# Patient Record
Sex: Female | Born: 1990 | Race: Black or African American | Hispanic: No | Marital: Single | Smoking: Never smoker
Health system: Southern US, Community
[De-identification: ages and names within clinical notes are randomized; demographics above are authoritative.]

---

## 2012-10-20 ENCOUNTER — Ambulatory Visit (INDEPENDENT_AMBULATORY_CARE_PROVIDER_SITE_OTHER): Payer: BC Managed Care – PPO | Admitting: Emergency Medicine

## 2012-10-20 VITALS — BP 114/73 | HR 74 | Temp 98.2°F | Resp 16 | Ht 65.0 in | Wt 184.0 lb

## 2012-10-20 DIAGNOSIS — T887XXA Unspecified adverse effect of drug or medicament, initial encounter: Secondary | ICD-10-CM

## 2012-10-20 DIAGNOSIS — T50905A Adverse effect of unspecified drugs, medicaments and biological substances, initial encounter: Secondary | ICD-10-CM

## 2012-10-20 DIAGNOSIS — L500 Allergic urticaria: Secondary | ICD-10-CM

## 2012-10-20 MED ORDER — METHYLPREDNISOLONE ACETATE 80 MG/ML IJ SUSP
120.0000 mg | Freq: Once | INTRAMUSCULAR | Status: AC
  Administered 2012-10-20: 120 mg via INTRAMUSCULAR

## 2012-10-20 MED ORDER — DIPHENHYDRAMINE HCL 25 MG PO TABS: 25.0000 mg | ORAL_TABLET | Freq: Four times a day (QID) | ORAL | Status: DC | PRN

## 2012-10-20 NOTE — Addendum Note (Signed)
Addended by: Carmelina Dane on: 10/20/2012 10:48 AM   Modules accepted: Orders

## 2012-10-20 NOTE — Patient Instructions (Addendum)

## 2012-10-20 NOTE — Progress Notes (Signed)
[facility] [address], [place] [address] [phone]  Date:  10/20/2012   Name:  Taylor Daniel   DOB:  03-31-1991   MRN:  [id number]  PCP:  No primary provider on file.    Chief Complaint: Rash   History of Present Illness:  Taylor Daniel is a 22 y.o. very pleasant female patient who presents with the following:  Took flagyl for a week for BV and stopped four days ago.  Now has an erythematous pruritic rash on abdomen, back, neck and both axillae.  No fever chills, blisters, nausea or vomiting.  No other complaints or concerns.  No improvement with topical lotions.  There is no problem list on file for this patient.   History reviewed. No pertinent past medical history.  History reviewed. No pertinent past surgical history.  History  Substance Use Topics  . Smoking status: Never Smoker   . Smokeless tobacco: Not on file  . Alcohol Use: Not on file    History reviewed. No pertinent family history.  No Known Allergies  Medication list has been reviewed and updated.  No current outpatient prescriptions on file prior to visit.   No current facility-administered medications on file prior to visit.    Review of Systems:  As per HPI, otherwise negative.    Physical Examination: Filed Vitals:   10/20/12 0945  BP: 114/73  Pulse: 74  Temp: 98.2 F (36.8 C)  Resp: 16   Filed Vitals:   10/20/12 0945  Height: 5\' 5"  (1.651 m)  Weight: 184 lb (83.462 kg)   Body mass index is 30.62 kg/(m^2). Ideal Body Weight: Weight in (lb) to have BMI = 25: 149.9   GEN: WDWN, NAD, Non-toxic, Alert & Oriented x 3 HEENT: Atraumatic, Normocephalic.  Ears and Nose: No external deformity. EXTR: No clubbing/cyanosis/edema NEURO: Normal gait.  PSYCH: Normally interactive. Conversant. Not depressed or anxious appearing.  Calm demeanor.  SKIN:  Urticarial rash on trunk and arms.  Assessment and Plan: Allergic hives secondary to  flagyl Benadryl Depo medrol  Carmelina Dane, MD

## 2014-05-31 ENCOUNTER — Ambulatory Visit (INDEPENDENT_AMBULATORY_CARE_PROVIDER_SITE_OTHER): Payer: Managed Care, Other (non HMO) | Admitting: Family Medicine

## 2014-05-31 VITALS — BP 97/63 | HR 67 | Temp 98.0°F | Resp 18 | Ht 64.0 in | Wt 202.6 lb

## 2014-05-31 DIAGNOSIS — Z0000 Encounter for general adult medical examination without abnormal findings: Secondary | ICD-10-CM

## 2014-05-31 NOTE — Progress Notes (Signed)

## 2014-05-31 NOTE — Progress Notes (Signed)
Patient ID: Taylor Daniel MRN: [id number], DOB: October 22, 1990, 23 y.o. Date of Encounter: 05/31/2014, 12:28 PM  Primary Physician: No primary provider on file.  Chief Complaint: Physical (CPE)  HPI: 23 y.o. y/o female with history of noted below here for CPE.  Doing well. No issues/complaints. Has an undergraduate degree in nutrition. 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 Eyes: No visual changes, eye redness, or discharge. ENT/Mouth: Ears: No otalgia, tinnitus, hearing loss, discharge. Nose: No congestion, rhinorrhea, sinus pain, or epistaxis. Throat: No sore throat, post nasal drip, or teeth pain. Cardiovascular: No CP, palpitations, diaphoresis, DOE, edema, orthopnea, PND. Respiratory: No cough, hemoptysis, SOB, or wheezing. Gastrointestinal: No anorexia, dysphagia, reflux, pain, nausea, vomiting, hematemesis, diarrhea, constipation, BRBPR, or melena. Breast: No discharge, pain, swelling, or mass. Genitourinary: No dysuria, frequency, urgency, hematuria, incontinence, nocturia, amenorrhea, vaginal discharge, pruritis, burning, abnormal bleeding, or pain. Musculoskeletal: No decreased ROM, myalgias, stiffness, joint swelling, or weakness. Skin: No rash, erythema, lesion changes, pain, warmth, jaundice, or pruritis. Neurological: No headache, dizziness, syncope, seizures, tremors, memory loss, coordination problems, or paresthesias. Psychological: No anxiety, depression, hallucinations, SI/HI. Endocrine: No fatigue, polydipsia, polyphagia, polyuria, or known diabetes. All other systems were reviewed and are otherwise negative.  History reviewed. No pertinent past medical history.   History reviewed. No pertinent past surgical history.  Home Meds:  Prior to Admission medications   Medication Sig Start Date End Date  Taking? Authorizing Provider  diphenhydrAMINE (BENADRYL) 25 MG tablet Take 1-2 tablets (25-50 mg total) by mouth every 6 (six) hours as needed for itching. 10/20/12  Yes Carmelina Dane, MD    Allergies: No Known Allergies  History   Social History  . Marital Status: Single    Spouse Name: N/A    Number of Children: N/A  . Years of Education: N/A   Occupational History  . Not on file.   Social History Main Topics  . Smoking status: Never Smoker   . Smokeless tobacco: Not on file  . Alcohol Use: Yes  . Drug Use: No  . Sexual Activity: Yes   Other Topics Concern  . Not on file   Social History Narrative  . No narrative on file    History reviewed. No pertinent family history.  Physical Exam: Blood pressure 97/63, pulse 67, temperature 98 F (36.7 C), temperature source Oral, resp. rate 18, height 5\' 4"  (1.626 m), weight 202 lb 9.6 oz (91.899 kg), last menstrual period 05/03/2014, SpO2 100.00%., Body mass index is 34.76 kg/(m^2). Wt Readings from Last 3 Encounters:  05/31/14 202 lb 9.6 oz (91.899 kg)  10/20/12 184 lb (83.462 kg)   BP Readings from Last 3 Encounters:  05/31/14 97/63  10/20/12 114/73   General: Well developed, well nourished, in no acute distress. HEENT: Normocephalic, atraumatic. Conjunctiva pink, sclera non-icteric. Pupils 2 mm constricting to 1 mm, round, regular, and equally reactive to light and accomodation. EOMI. Internal auditory canal clear. TMs with good cone of light and without pathology. Nasal mucosa pink. Nares are without discharge. No sinus tenderness. Oral mucosa pink. Dentition good. Pharynx without exudate.     Visual Acuity  Right Eye Distance:   Left Eye Distance:   Bilateral Distance:    Right Eye Near:   Left Eye Near:    Bilateral Near:      Neck: Supple. Trachea midline. No thyromegaly. Full  ROM. No lymphadenopathy. Lungs: Clear to auscultation bilaterally without wheezes, rales, or rhonchi. Breathing is of normal effort  and unlabored. Cardiovascular: RRR with S1 S2. No murmurs, rubs, or gallops appreciated. Distal pulses 2+ symmetrically. No carotid or abdominal bruits Abdomen: Soft, non-tender, non-distended with normoactive bowel sounds. No hepatosplenomegaly or masses. No rebound/guarding. No CVA tenderness. Without hernias.  Musculoskeletal: Full range of motion and 5/5 strength throughout. Without swelling, atrophy, tenderness, crepitus, or warmth. Extremities without clubbing, cyanosis, or edema. Calves supple. Skin: Warm and moist without erythema, ecchymosis, wounds, or rash. Neuro: A+Ox3. CN II-XII grossly intact. Moves all extremities spontaneously. Full sensation throughout. Normal gait. DTR 2+ throughout upper and lower extremities. Finger to nose intact. Psych:  Responds to questions appropriately with a normal affect.   No results found for this basename: CHOL, HDL, LDLCALC, LDLDIRECT, TRIG, CHOLHDL    Assessment/Plan:  23 y.o. y/o female here for CPE No problems identified  Signed, Elvina SidleKurt Iran Rowe, MD 05/31/2014 12:28 PM

## 2014-08-12 ENCOUNTER — Emergency Department (INDEPENDENT_AMBULATORY_CARE_PROVIDER_SITE_OTHER): Payer: 59

## 2014-08-12 ENCOUNTER — Encounter (HOSPITAL_COMMUNITY): Payer: Self-pay | Admitting: Emergency Medicine

## 2014-08-12 ENCOUNTER — Emergency Department (INDEPENDENT_AMBULATORY_CARE_PROVIDER_SITE_OTHER)
Admission: EM | Admit: 2014-08-12 | Discharge: 2014-08-12 | Disposition: A | Discharge status: Home / Self Care | Payer: 59 | Source: Home / Self Care | Attending: Emergency Medicine | Admitting: Emergency Medicine

## 2014-08-12 DIAGNOSIS — S93401A Sprain of unspecified ligament of right ankle, initial encounter: Secondary | ICD-10-CM

## 2014-08-12 DIAGNOSIS — S99919A Unspecified injury of unspecified ankle, initial encounter: Secondary | ICD-10-CM

## 2014-08-12 NOTE — Discharge Instructions (Signed)
You have a bad sprain. Wear the brace until the pain and swelling improve. Do range of motion 2-3 times a day. Continue the aleve 400mg  every 4 hours for another week. Ice the ankle 2-3 times a day.  If you are not improving in the next 1-2 weeks, please follow up with sports medicine.

## 2014-08-12 NOTE — ED Provider Notes (Signed)
CSN: [id number]     Arrival date & time 08/12/14  1849 History   First MD Initiated Contact with Patient 08/12/14 1938     Chief Complaint  Patient presents with  . Ankle Injury   (Consider location/radiation/quality/duration/timing/severity/associated sxs/prior Treatment) HPI  She is a 23 year old woman here for evaluation of right ankle pain. She states she rolled the ankle playing basketball 2 weeks ago. She has been treating it as a sprain with ice and aleve 400mg  q4hrs, but it has remained swollen and painful. She states she has had a fracture of the ankle previously. She is able to bear weight on it. She has pain with inverting her ankle.  History reviewed. No pertinent past medical history. History reviewed. No pertinent past surgical history. No family history on file. History  Substance Use Topics  . Smoking status: Never Smoker   . Smokeless tobacco: Not on file  . Alcohol Use: Yes   OB History    No data available     Review of Systems Right ankle injury Allergies  Review of patient's allergies indicates no known allergies.  Home Medications   Prior to Admission medications   Medication Sig Start Date End Date Taking? Authorizing Provider  diphenhydrAMINE (BENADRYL) 25 MG tablet Take 1-2 tablets (25-50 mg total) by mouth every 6 (six) hours as needed for itching. 10/20/12   Carmelina DaneJeffery S Anderson, MD   BP 114/73 mmHg  Pulse 62  Temp(Src) 98.3 F (36.8 C) (Oral)  Resp 18  SpO2 99%  LMP 07/29/2014 (Approximate) Physical Exam  Constitutional: She is oriented to person, place, and time. She appears well-developed and well-nourished. No distress.  Cardiovascular: Normal rate.   Pulmonary/Chest: Effort normal.  Musculoskeletal:  Right ankle: swelling over lateral malleolus.  Tender at posterior lateral and medial malleoli.  No joint laxity.  5/5 strength in dorsiflexion (but with pain), plantar-flexion, inversion and eversion; pain active and passive inversion of the  ankle  Neurological: She is alert and oriented to person, place, and time.    ED Course  Procedures (including critical care time) Labs Review Labs Reviewed - No data to display  Imaging Review Dg Ankle Complete Right  08/12/2014   CLINICAL DATA:  Persistent right ankle pain and swelling after an injury while playing basketball approximately 2 weeks ago. Initial encounter.  EXAM: RIGHT ANKLE - COMPLETE 3+ VIEW  COMPARISON:  None.  FINDINGS: Diffuse soft tissue swelling. No evidence of acute fracture. Ankle mortise intact with well-preserved joint space. Well-preserved bone mineral density. No intrinsic osseous abnormalities. No visible joint effusion.  IMPRESSION: No osseous abnormality.   Electronically Signed   By: Hulan Saashomas  Lawrence M.D.   On: 08/12/2014 20:09     MDM   1. Right ankle sprain, initial encounter   2. Ankle injury    No fracture. ASO brace applied. Continue ice and Aleve for the next week. Gentle range of motion exercises 2-3 times a day. If no improvement in 1-2 weeks, follow-up with sports medicine.    Charm RingsErin J Leita Lindbloom, MD 08/12/14 2019

## 2014-08-12 NOTE — ED Notes (Signed)
Patient c/o right ankle pain after injuring it while playing basketball x 2 weeks ago. Patient reports she has previously fractured that ankle. Patient has been using ice and elevation and aleeve for pain. Ankle is visibly swollen and tender to the touch. Patient is in NAD.

## 2015-04-25 IMAGING — CR DG ANKLE COMPLETE 3+V*R*
3 series · 3 of 3 positions shown · non-contrast
Comparison: None.

CLINICAL DATA: Persistent right ankle pain and swelling after an
injury while playing basketball approximately 2 weeks ago. Initial
encounter.

EXAM:
RIGHT ANKLE - COMPLETE 3+ VIEW

[ankle ap]
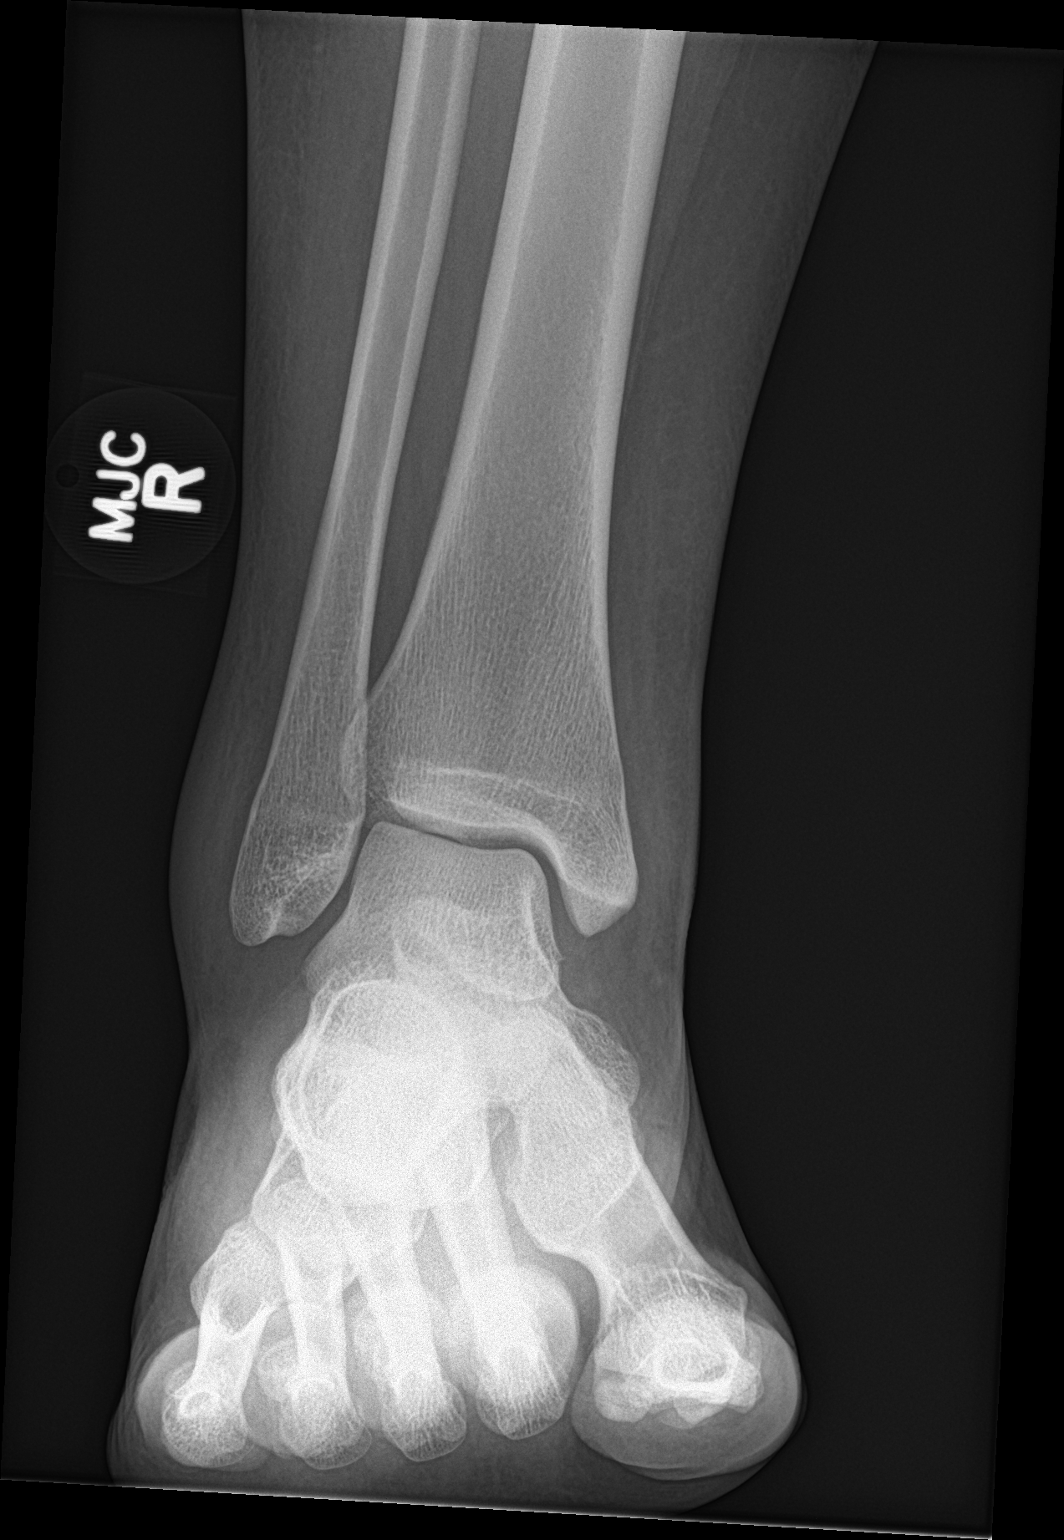

[ankle lat]
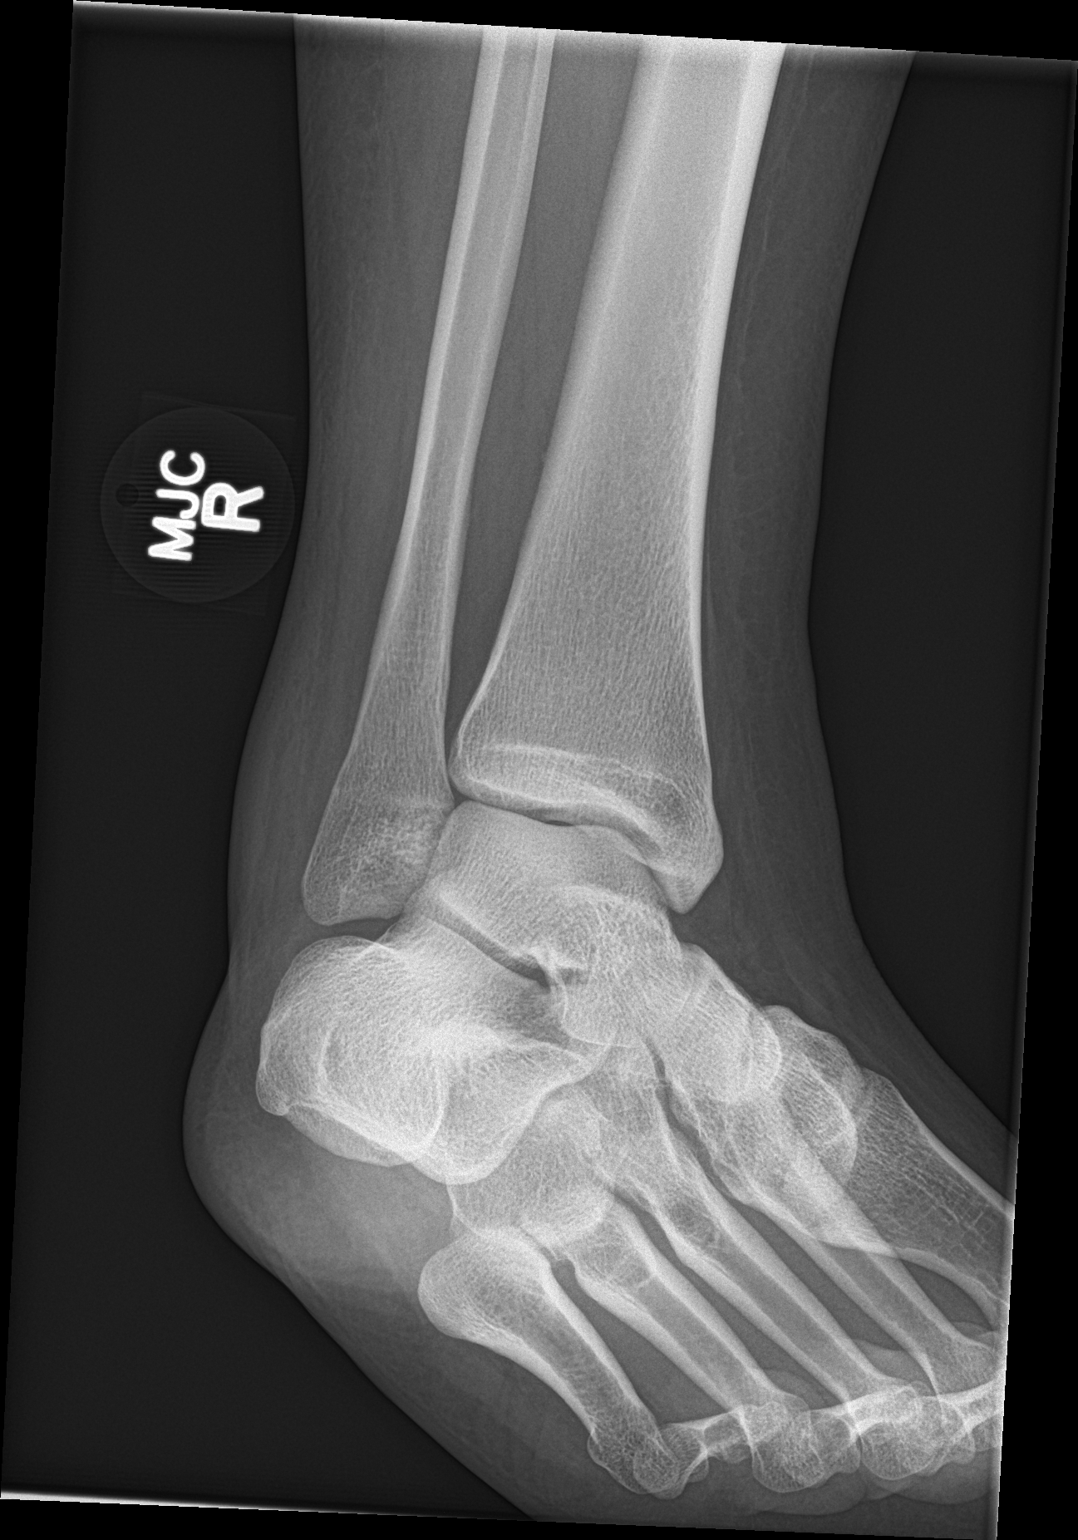

[ankle obl]
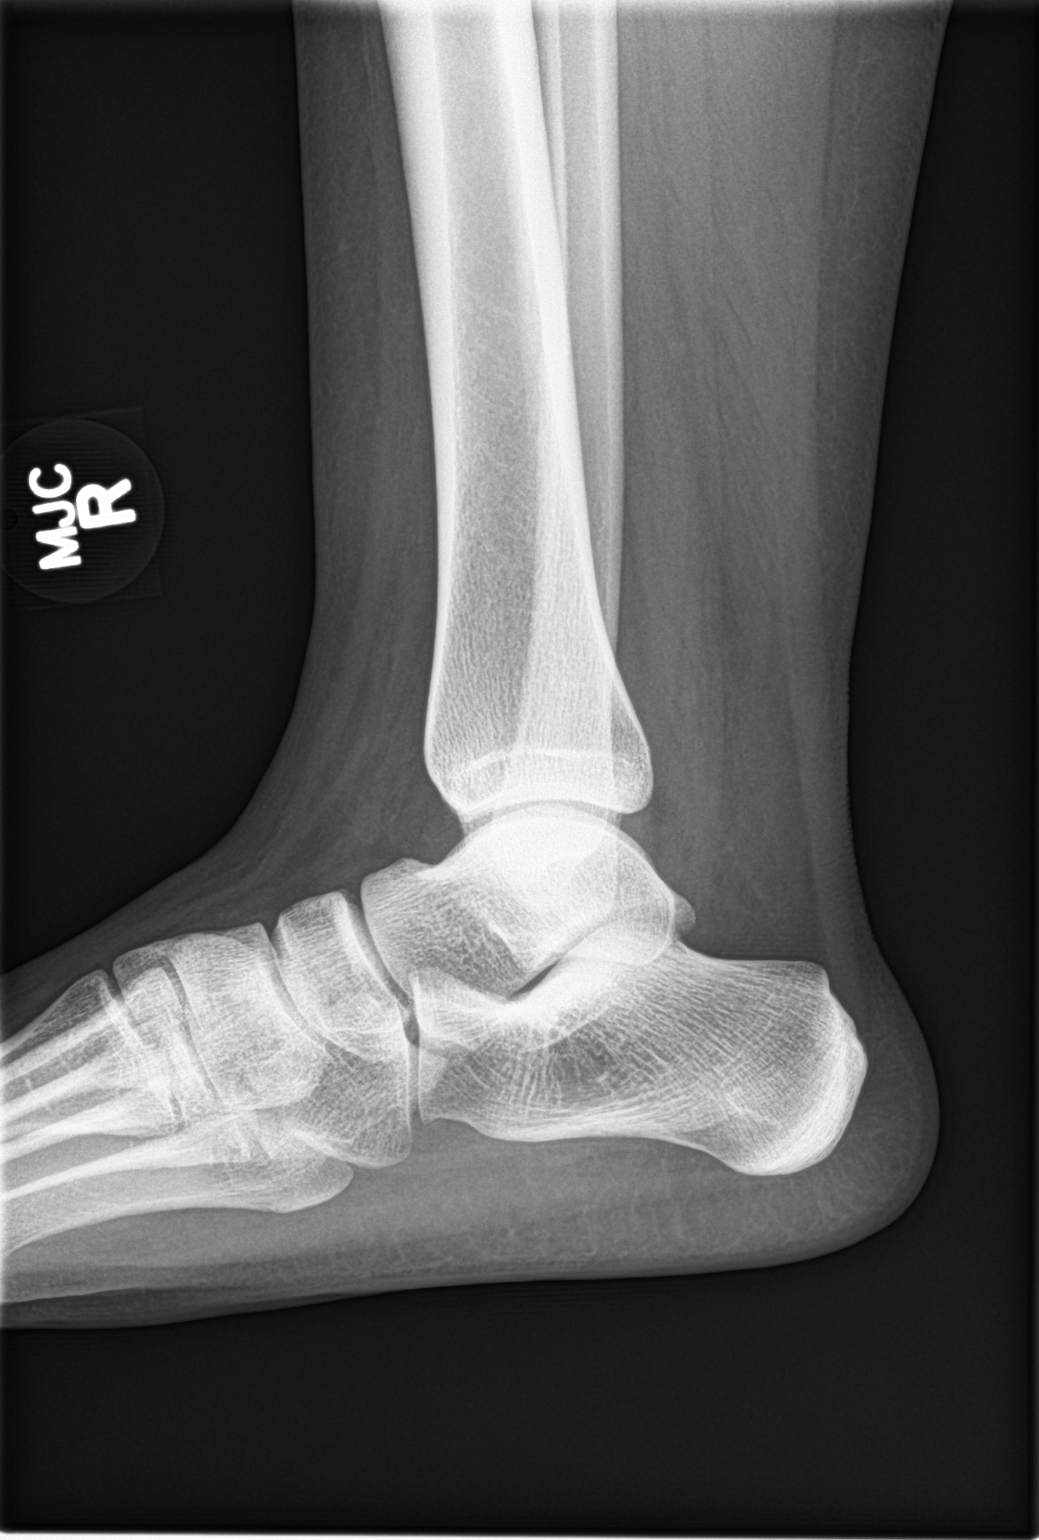

[3 of 3 positions shown; findings below may reference images not displayed]

FINDINGS: Diffuse soft tissue swelling. No evidence of acute fracture. Ankle
mortise intact with well-preserved joint space. Well-preserved bone
mineral density. No intrinsic osseous abnormalities. No visible
joint effusion.
IMPRESSION: No osseous abnormality.

## 2015-07-04 ENCOUNTER — Encounter (HOSPITAL_COMMUNITY): Payer: Self-pay | Admitting: Emergency Medicine

## 2015-07-04 ENCOUNTER — Emergency Department (HOSPITAL_COMMUNITY)
Admission: EM | Admit: 2015-07-04 | Discharge: 2015-07-04 | Disposition: A | Discharge status: Home / Self Care | Payer: 59 | Attending: Emergency Medicine | Admitting: Emergency Medicine

## 2015-07-04 DIAGNOSIS — R45851 Suicidal ideations: Secondary | ICD-10-CM | POA: Insufficient documentation

## 2015-07-04 DIAGNOSIS — F329 Major depressive disorder, single episode, unspecified: Secondary | ICD-10-CM | POA: Diagnosis not present

## 2015-07-04 DIAGNOSIS — R1011 Right upper quadrant pain: Secondary | ICD-10-CM | POA: Insufficient documentation

## 2015-07-04 DIAGNOSIS — Z008 Encounter for other general examination: Secondary | ICD-10-CM | POA: Diagnosis present

## 2015-07-04 LAB — RAPID URINE DRUG SCREEN, HOSP PERFORMED
AMPHETAMINES: NOT DETECTED
Barbiturates: NOT DETECTED
Benzodiazepines: NOT DETECTED
Cocaine: NOT DETECTED
OPIATES: NOT DETECTED
Tetrahydrocannabinol: NOT DETECTED

## 2015-07-04 LAB — ETHANOL

## 2015-07-04 LAB — CBC
HCT: 41 % (ref 36.0–46.0)
Hemoglobin: 13 g/dL (ref 12.0–15.0)
MCH: 25.2 pg — AB (ref 26.0–34.0)
MCHC: 31.7 g/dL (ref 30.0–36.0)
MCV: 79.6 fL (ref 78.0–100.0)
PLATELETS: 242 10*3/uL (ref 150–400)
RBC: 5.15 MIL/uL — ABNORMAL HIGH (ref 3.87–5.11)
RDW: 15.7 % — AB (ref 11.5–15.5)
WBC: 5.4 10*3/uL (ref 4.0–10.5)

## 2015-07-04 LAB — COMPREHENSIVE METABOLIC PANEL
ALT: 13 U/L — AB (ref 14–54)
AST: 20 U/L (ref 15–41)
Albumin: 4.6 g/dL (ref 3.5–5.0)
Alkaline Phosphatase: 39 U/L (ref 38–126)
Anion gap: 9 (ref 5–15)
BILIRUBIN TOTAL: 1.6 mg/dL — AB (ref 0.3–1.2)
BUN: 8 mg/dL (ref 6–20)
CALCIUM: 9.4 mg/dL (ref 8.9–10.3)
CO2: 24 mmol/L (ref 22–32)
CREATININE: 0.69 mg/dL (ref 0.44–1.00)
Chloride: 107 mmol/L (ref 101–111)
Glucose, Bld: 109 mg/dL — ABNORMAL HIGH (ref 65–99)
Potassium: 3.1 mmol/L — ABNORMAL LOW (ref 3.5–5.1)
Sodium: 140 mmol/L (ref 135–145)
TOTAL PROTEIN: 7.8 g/dL (ref 6.5–8.1)

## 2015-07-04 LAB — ACETAMINOPHEN LEVEL: Acetaminophen (Tylenol), Serum: <10 ug/mL — ABNORMAL LOW (ref 10–30)

## 2015-07-04 LAB — LIPASE, BLOOD: LIPASE: 28 U/L (ref 11–51)

## 2015-07-04 LAB — SALICYLATE LEVEL

## 2015-07-04 MED ORDER — POTASSIUM CHLORIDE CRYS ER 20 MEQ PO TBCR
40.0000 meq | EXTENDED_RELEASE_TABLET | Freq: Once | ORAL | Status: AC
  Administered 2015-07-04: 40 meq via ORAL
  Filled 2015-07-04: qty 2

## 2015-07-04 NOTE — ED Notes (Signed)
IVC paperwork states that "pt with increasing depression now with auditory hallucinations command type to kill herself. Felt to be danger to herself".

## 2015-07-04 NOTE — ED Notes (Signed)
PA at bedside.

## 2015-07-04 NOTE — ED Provider Notes (Signed)
CSN: [id number]     Arrival date & time 07/04/15  1824 History   First MD Initiated Contact with Patient 07/04/15 2017     Chief Complaint  Patient presents with  . IVC     HPI  Taylor Daniel is a 24 y.o. female with no pertinent PMH who presents to the ED with suicidal ideation. She states she has felt more depressed and has had thoughts of wanting to harm herself for the last several weeks. She states she has no plan to harm herself. She denies homicidal ideation. She reports auditory hallucination, however states she does not remember what the voices tell her. She denies visual hallucinations. She denies current alcohol or drug use, and states her last alcohol use was approximately 2 months ago; she reports she does not remember when she last used marijuana. She states she has tried to harm herself in the past by cutting herself. She reports she has experienced intermittent RUQ pain, but currently reports "I'm fine." She denies fever, chills, headache, lightheadedness, chest pain, shortness of breath, N/V/D/C.    History reviewed. No pertinent past medical history. No past surgical history on file. No family history on file. Social History  Substance Use Topics  . Smoking status: Never Smoker   . Smokeless tobacco: None  . Alcohol Use: Yes   OB History    No data available      Review of Systems  Constitutional: Negative for fever and chills.  Respiratory: Negative for shortness of breath.   Cardiovascular: Negative for chest pain.  Gastrointestinal: Positive for abdominal pain. Negative for nausea, vomiting, diarrhea and constipation.  Neurological: Negative for dizziness, weakness, light-headedness, numbness and headaches.  Psychiatric/Behavioral: Positive for suicidal ideas. Negative for hallucinations.      Allergies  Review of patient's allergies indicates no known allergies.  Home Medications   Prior to Admission medications   Medication Sig Start Date  End Date Taking? Authorizing Provider  ibuprofen (ADVIL,MOTRIN) 200 MG tablet Take 400 mg by mouth every 6 (six) hours as needed for headache or moderate pain.   Yes Historical Provider, MD    BP 105/67 mmHg  Pulse 78  Temp(Src) 97.7 F (36.5 C) (Oral)  Resp 16  SpO2 99%  LMP 07/04/2015 (Exact Date) Physical Exam  Constitutional: She is oriented to person, place, and time. She appears well-developed and well-nourished. No distress.  HENT:  Head: Normocephalic and atraumatic.  Right Ear: External ear normal.  Left Ear: External ear normal.  Nose: Nose normal.  Mouth/Throat: Uvula is midline, oropharynx is clear and moist and mucous membranes are normal.  Eyes: Conjunctivae, EOM and lids are normal. Pupils are equal, round, and reactive to light. Right eye exhibits no discharge. Left eye exhibits no discharge. No scleral icterus.  Neck: Normal range of motion. Neck supple.  Cardiovascular: Normal rate, regular rhythm, normal heart sounds, intact distal pulses and normal pulses.   Pulmonary/Chest: Effort normal and breath sounds normal. No respiratory distress. She has no wheezes. She has no rales.  Abdominal: Soft. Normal appearance and bowel sounds are normal. She exhibits no distension and no mass. There is no tenderness. There is no rigidity, no rebound, no guarding and negative Murphy's sign.  Musculoskeletal: Normal range of motion. She exhibits no edema or tenderness.  Neurological: She is alert and oriented to person, place, and time. She has normal strength. No cranial nerve deficit or sensory deficit.  Skin: Skin is warm, dry and intact. No rash noted. She is  not diaphoretic. No erythema. No pallor.  Psychiatric: Her speech is normal. Her affect is blunt. She is slowed. She expresses suicidal ideation. She expresses no homicidal ideation. She expresses suicidal plans. She expresses no homicidal plans.  Nursing note and vitals reviewed.   ED Course  Procedures (including  critical care time)  Labs Review Labs Reviewed  COMPREHENSIVE METABOLIC PANEL - Abnormal; Notable for the following:    Potassium 3.1 (*)    Glucose, Bld 109 (*)    ALT 13 (*)    Total Bilirubin 1.6 (*)    All other components within normal limits  ACETAMINOPHEN LEVEL - Abnormal; Notable for the following:    Acetaminophen (Tylenol), Serum <10 (*)    All other components within normal limits  CBC - Abnormal; Notable for the following:    RBC 5.15 (*)    MCH 25.2 (*)    RDW 15.7 (*)    All other components within normal limits  ETHANOL  SALICYLATE LEVEL  URINE RAPID DRUG SCREEN, HOSP PERFORMED  LIPASE, BLOOD    Imaging Review No results found.   I have personally reviewed and evaluated these images and lab results as part of my medical decision-making.   EKG Interpretation None      MDM   Final diagnoses:  Suicidal ideation    24 year old female presents with worsening depression and suicidal ideation over the past several weeks. Denies homicidal ideation. Denies current drug or alcohol use. Reports auditory hallucinations, however states she cannot remember what the voices say. Denies visual hallucinations. Reports intermittent right upper quadrant pain. Denies fever, chills, headache, lightheadedness, chest pain, shortness of breath, N/V/D/C.   Patient is afebrile. Vital signs stable. Heart regular rate and rhythm. Lungs clear to auscultation bilaterally. Abdomen soft, nontender, nondistended. No rebound, guarding, or masses. No Murphy's sign. Patient appears depressed and has a flat affect on exam.  BMP remarkable for potassium 3.1, repleted in the ED. Bilirubin mildly elevated at 1.6. Transaminases within normal limits. Lipase unremarkable. CBC negative for leukocytosis or anemia. Tylenol, salicylate, ethanol negative. UDS negative.  TTS consulted. Patient medically cleared at this time; advised to follow up with primary care physician for repeat bilirubin. Do not  feel imaging is indicated at this time given patient is afebrile, has no tenderness on exam, and transaminases are within normal limits.  Spoke with TTS, and was advised patient is already placed at [facility]. Patient discharged to [facility].  BP 105/67 mmHg  Pulse 78  Temp(Src) 97.7 F (36.5 C) (Oral)  Resp 16  SpO2 99%  LMP 07/04/2015 (Exact Date)     Mady Gemma, PA-C 07/05/15 0024  Glynn Octave, MD 07/05/15 [phone]

## 2015-07-04 NOTE — ED Notes (Signed)
Pt BIB [facility].  [facility] and endorsed suicidality w/ AH to kill herself.  [facility] and [facility] upon medical clearance.

## 2015-07-04 NOTE — BHH Counselor (Signed)
Counselor spoke with Glean HessElizabeth Westfall, PA, about need for TTS consult. Since [facility] and here only for med clearance, TTS consult is not needed at this time. PA plans to remove consult.   Cyndie MullAnna Kennen Stammer, [facility]PC

## 2015-07-04 NOTE — ED Notes (Signed)
Taylor CorningCynthia Daniel [phone] Mother to call for updates.

## 2015-07-04 NOTE — Progress Notes (Signed)
Patient listed as having [employer] insurance without a pcp.  EDCM spoke to patient at bedside.  Patient confirms she does not have a pcp.  [facility] within a ten mile radius of patient's zip code [phone].  Patient thankful for resources.  No further EDCM needs st this time.

## 2015-07-04 NOTE — Discharge Instructions (Signed)
°Emergency Department Resource Guide °1) Find a Doctor and Pay Out of Pocket °Although you won't have to find out who is covered by your insurance plan, it is a good idea to ask around and get recommendations. You will then need to call the office and see if the doctor you have chosen will accept you as a new patient and what types of options they offer for patients who are self-pay. Some doctors offer discounts or will set up payment plans for their patients who do not have insurance, but you will need to ask so you aren't surprised when you get to your appointment. ° °2) Contact Your Local Health Department °Not all health departments have doctors that can see patients for sick visits, but many do, so it is worth a call to see if yours does. If you don't know where your local health department is, you can check in your phone book. The CDC also has a tool to help you locate your state's health department, and many state websites also have listings of all of their local health departments. ° °3) Find a Walk-in Clinic °If your illness is not likely to be very severe or complicated, you may want to try a walk in clinic. These are popping up all over the country in pharmacies, drugstores, and shopping centers. They're usually staffed by nurse practitioners or physician assistants that have been trained to treat common illnesses and complaints. They're usually fairly quick and inexpensive. However, if you have serious medical issues or chronic medical problems, these are probably not your best option. ° °No Primary Care Doctor: °- Call [facility] at  832-8000 - they can help you locate a primary care doctor that  accepts your insurance, provides certain services, etc. °- Physician Referral Service- 1-800-533-3463 ° °Chronic Pain Problems: °Organization         Address  Phone   Notes  °[facility]  (336) 297-2271 Patients need to be referred by their primary care doctor.  ° °Medication  Assistance: °Organization         Address  Phone   Notes  °[place] [facility] [address] °[place], [place] [address] (336) 641-8030 --Must be a resident of [place] °-- Must have NO insurance coverage whatsoever (no Medicaid/ Medicare, etc.) °-- The pt. MUST have a primary care doctor that directs their care regularly and follows them in the community °  °[facility]  (866) 331-1348   °[employer]  (888) 892-1162   ° °Agencies that provide inexpensive medical care: °Organization         Address  Phone   Notes  °[facility]  (336) 832-8035   °[facility]    (336) 832-7272   °[facility] [address] °[place], [place] [address] (336) 832-4777   °[facility] [address], °[place] (336) 271-4999   °[facility]    (336) 373-0678   °[facility]    (336) 272-1050   °[facility] ° [address], [place] Phone:  (336) 832-4444, Fax:  (336) 832-4440 Hours of Operation:  9 am - 6 pm, M-F.  Also accepts Medicaid/Medicare and self-pay.  °[facility] ° [address], [place] Phone: (336) [phone], Fax: (336) [phone]. Hours of Operation:  8:30 am - 5:30 pm, M-F.  Also accepts Medicaid and self-pay.  °[facility] [place] [address], [place] Phone: (336) 878-6027   °[facility] [address], [place], [place] (336)723-1848, Ext. 123 Mondays & Thursdays: 7-9 AM.  First 15 patients are seen on a first come, first serve basis. °  ° °Medicaid-accepting [place] Providers: ° °Organization         Address  Phone   Notes  °[facility] [address], [place] (336) 641-2100 Also accepts self-pay patients.  °[facility] [address], [place] ° (336) 856-9996   °[facility] [address], [place]  (336) 288-8857   °[facility] [address] [place] [address], [place] (336) 299-7000   °Veita Bland [address], [place]  ° (336) 373-1557 Only accepts [place] Access Medicaid patients after they have their name applied to their card.  ° °Self-Pay (no insurance) in [place]: ° °Organization         Address  Phone   Notes  °Sickle Cell Patients, [facility] [address], [place] (336) 832-1970   °[facility] [address], [place] (336) 832-4400   °[facility] [place] ° [address], [place] (336) 992-4800   °[facility]/Dr. Osei-Bonsu ° [address] [place] [address], [place] or [address], [place] (336) 841-8500 Phone number for both [place] and [place] locations is the same.  °[facility] [address], [place] (336) 299-0000   °[facility] [address] [place] [address], [place] or [address] (336) 852-7530 °(336) 878-2260   °[facility] [address], [place] (336) 350-1642, phone; (336) 294-5005, fax Sees patients 1st and 3rd Saturday of every month.  Must not qualify for public or private insurance (i.e. Medicaid, Medicare, [place] Health Choice, Veterans' Benefits) • Household income should be no more than 200% of the poverty level •The clinic cannot treat you if you are pregnant or think you are pregnant • Sexually transmitted diseases are not treated at the clinic.  ° ° °Dental Care: °Organization         Address  Phone  Notes  °[place] [facility] [address], [place] (336) 641-6152 Accepts children up to age 21 who are enrolled in Medicaid or [place] Health Choice; pregnant women with a Medicaid card; and children who have applied for Medicaid or [place] Health Choice, but were declined, whose parents can pay a reduced fee at time of service.  °[place]  [facility] [place]  [address], [place] (336) 641-7733 Accepts children up to age 21 who are enrolled in Medicaid or [place] Health Choice; pregnant women with a Medicaid card; and children who have applied for Medicaid or  Health Choice, but were declined, whose parents can pay a reduced fee at time of service.  °[facility] ° [address],  (336) 641-4533 Patients are seen by appointment only. Walk-ins are not accepted. [facility] will see patients 18 years of age and older. °Monday - Tuesday (8am-5pm) °Most Wednesdays (8:30-5pm) °$30 per visit, cash only  °[facility] ° [address], [place] (336) 641-4533 Patients are seen by appointment only. Walk-ins are not accepted. [facility] will see patients 18 years of age and older. °One   Wednesday Evening (Monthly: Volunteer Based).  $30 per visit, cash only  °[facility]  (919) 537-3737 for adults; Children under age 4, call Graduate Pediatric Dentistry at ([phone]) [phone]. Children aged 4-14, please call ([phone]) [phone] to request a pediatric application. ° Dental services are provided in all areas of dental care including fillings, crowns and bridges, complete and partial dentures, implants, gum treatment, root canals, and extractions. Preventive care is also provided. Treatment is provided to both adults and children. °Patients are selected via a lottery and there is often a waiting list. °  °[facility] [address], °[place] ° (336) 763-8833 [email or web address] °  °[facility] [address], [place], [place] (336)723-1848, Ext. 123 Second and Fourth Thursday of each month, opens at 6:30 AM; Clinic ends at 9 AM.  Patients are seen on a first-come first-served basis, and a limited number are seen during each clinic.  ° °[facility] ° [address], [place], [place] (336) 723-7904    Eligibility Requirements °You must have lived in [place], [place], or [place] counties for at least the last three months. °  You cannot be eligible for state or federal sponsored healthcare insurance, including Veterans Administration, Medicaid, or Medicare. °  You generally cannot be eligible for healthcare insurance through your employer.  °  How to apply: °Eligibility screenings are held every Tuesday and Wednesday afternoon from 1:00 pm until 4:00 pm. You do not need an appointment for the interview!  °[facility] [address], [place], [place] 336-631-2330   °[facility]  336-342-8273   °[facility]  336-703-3100   °[facility]  336-570-6415   ° °Behavioral Health Resources in the Community: °Intensive Outpatient Programs °Organization         Address  Phone  Notes  °[facility] [address], [place], [place] 336-878-6098   °[facility] [address], [place], [place] 336-832-9800   °[facility] [address], [place], [place] ° 336-882-2125   °[facility] [address],  °[place], [place] 1-800-853-5163 or 336-641-4981   °Substance Abuse Resources °Organization         Address  Phone  Notes  °[facility]  336-882-2125   °[facility]  336-784-9470   °[facility]  336-285-9073   °[facility]  336-845-3988   °Residential & Outpatient Substance Abuse Program  1-800-659-3381   °Psychological Services °Organization         Address  Phone  Notes  °[facility]  336- 832-9600   °[facility]  336- 378-7881   °[facility] [address], [place] 1-800-853-5163 or 336-641-4981   ° °Mobile Crisis Teams °Organization         Address  Phone  Notes  °[facility], Mobile Crisis Care Unit  1-877-626-1772   °Assertive °[facility] ° [address]  [place], [place] 336-834-9664   °Sharon DeEsch [address] °[place] 336-554-5454   ° °Self-Help/Support Groups °Organization         Address  Phone             Notes  °[facility]  - variety of support groups  336- 373-1402 Call for more information  °Narcotics Anonymous (NA), [facility] [address], °[place]  2 meetings at this location  ° °  Residential Treatment Programs °Organization         Address  Phone  Notes  °[facility] [address],    °[place]  1-866-801-8205   °[facility] ° [address], [place], [place] 704-293-8524   °[facility] [address], [place] 336-845-3988 Admissions: 8am-3pm M-F  °[facility] [address]    °[place], [place] 336-841-1104   °[facility] [address], [place], [place] 336-379-7146   °[facility] [address]  °[place], [place] 336-285-9073   °[facility] Intensive Outpatient [address], [place], [place] 336-852-3033   °[facility] [address]  °[place], [place] 1-877-615-2722 or 336-784-9470   °[facility] [address] [place], [place] 336-227-7417 Accepts Medicaid  °[facility] [address]  °[place] 1-800-659-3381 Substance Abuse/Addiction Treatment  ° °[place] Behavioral Health Resources °Organization         Address  Phone  Notes  °[facility]  (888) 581-9988   °Julie Brannon, PhD [address] [place], [place]   (336) 349-5553 or (336) 951-0000   °[facility]   [address] °[place], [place] (336) 349-4454   °[facility] [address], [place], [place] (336) 342-8316 Insurance/Medicaid/sponsorship through [facility]  °[facility] [address]                                    [place], [place] (336) 342-8316 Therapy/tele-psych/case    °[facility] [address]  ° [place],  (336) 349-2233    °Dr. Arfeen  (336) 349-4544   °[facility] [place]  [employer] [place] [facility] 1) [address], [place] °2) [address], [place] °3)  [address], [place] (336) 349-3220 °(336) 342-7768 ° °(336) 342-8140   °[place] [facility] (336) 342-1394 or (336) 342-3537 (After Hours)    ° ° °
